# Patient Record
Sex: Female | Born: 2000
Health system: Southern US, Community
[De-identification: ages and names within clinical notes are randomized; demographics above are authoritative.]

## PROBLEM LIST (undated history)

## (undated) DIAGNOSIS — R51 Headache: Secondary | ICD-10-CM

## (undated) DIAGNOSIS — R519 Headache, unspecified: Secondary | ICD-10-CM

## (undated) HISTORY — DX: Headache, unspecified: R51.9

## (undated) HISTORY — DX: Headache: R51

---
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
## 2019-02-27 ENCOUNTER — Encounter (INDEPENDENT_AMBULATORY_CARE_PROVIDER_SITE_OTHER): Payer: Self-pay

## 2019-02-28 NOTE — Telephone Encounter (Signed)
Headache calendar from February 2020 on [place]. 28 days were recorded.  4 days were headache free.  24 days were associated with tension type headaches, 3 required treatment.  There were no days of migraines.   Headache calendar from March 2020 on [place]. 31 days were recorded.  4 days were headache free.  26 days were associated with tension type headaches, 4 required treatment.  There was 1 day of migraines, none were severe.  There is no reason to change current treatment.  I will contact the family.  Diane Robinson says that sumatriptan and helped her.

## 2019-03-04 ENCOUNTER — Ambulatory Visit (INDEPENDENT_AMBULATORY_CARE_PROVIDER_SITE_OTHER): Payer: Self-pay | Admitting: Pediatrics

## 2019-03-13 ENCOUNTER — Ambulatory Visit (INDEPENDENT_AMBULATORY_CARE_PROVIDER_SITE_OTHER): Payer: BLUE CROSS/BLUE SHIELD | Admitting: Pediatrics

## 2019-09-03 ENCOUNTER — Ambulatory Visit: Payer: Self-pay | Admitting: Allergy & Immunology

## 2019-09-03 ENCOUNTER — Other Ambulatory Visit: Payer: Self-pay

## 2020-03-30 ENCOUNTER — Other Ambulatory Visit: Payer: Self-pay

## 2020-03-30 ENCOUNTER — Ambulatory Visit (INDEPENDENT_AMBULATORY_CARE_PROVIDER_SITE_OTHER): Payer: No Typology Code available for payment source | Admitting: Family Medicine

## 2020-03-30 ENCOUNTER — Encounter: Payer: Self-pay | Admitting: Family Medicine

## 2020-03-30 VITALS — BP 111/77 | HR 99 | Temp 97.7°F | Ht <= 58 in | Wt 113.4 lb

## 2020-03-30 DIAGNOSIS — Z8481 Family history of carrier of genetic disease: Secondary | ICD-10-CM | POA: Diagnosis not present

## 2020-03-30 DIAGNOSIS — G43009 Migraine without aura, not intractable, without status migrainosus: Secondary | ICD-10-CM

## 2020-03-30 DIAGNOSIS — Z800 Family history of malignant neoplasm of digestive organs: Secondary | ICD-10-CM | POA: Insufficient documentation

## 2020-03-30 DIAGNOSIS — Z803 Family history of malignant neoplasm of breast: Secondary | ICD-10-CM | POA: Insufficient documentation

## 2020-03-30 DIAGNOSIS — Z8349 Family history of other endocrine, nutritional and metabolic diseases: Secondary | ICD-10-CM | POA: Insufficient documentation

## 2020-03-30 NOTE — Assessment & Plan Note (Signed)
Family history of breast cancer in her father's mother.  Diagnosed in her 21s.   Unsure of any other family members that have been tested for BRCA gene but her mother reports that she believes that the grandmother did have the BRCA gene.

## 2020-03-30 NOTE — Assessment & Plan Note (Signed)
Mother has history of hypothyroidism diagnosed in her early 53s.  Doing well with treatment.  Does not have any signs symptoms of hypothyroidism today in the office.  We will continue to assess for this and test as needed.

## 2020-03-30 NOTE — Assessment & Plan Note (Signed)
Mother's mother had colon cancer later on in life.  She reports that she was unsure if she had colon screenings as appropriate.

## 2020-03-30 NOTE — Progress Notes (Signed)
Subjective:  Patient ID: Diane Robinson, female    DOB: 2001-06-20  Age: 19 y.o. MRN: [id number]  CC:  Chief Complaint  Patient presents with  . New Patient (Initial Visit)    has been awhile since seeing any provider no concerns besides establishing care       HPI  HPI Diane Robinson is a 19 year old female patient who presents today to establish care.  Has not been seen by a provider since being in a pediatrics office.  She presents today with her twin, and her mother is in the room to provide family history and then left the room to provide them with privacy.  Significant history includes migraines.  In reviewing her chart it looks like she had postconcussion syndrome.  She reports that she takes Advil as needed for headaches.  Additionally uses sumatriptan hand and that works very well but she is not had to use it in some time.  Per her mother: Family history that significant for his mother that has hypothyroidism diagnosed in her early 39s, unsure of their father, type 2 diabetes, hypertension, colon cancer and her mother's mother, father's mother had breast cancer diagnosed in her 79s, she believes that she was BRCA positive.  The girls and not had genetic test.  Unsure of insurance coverage with this.  Neither one of them of had any changes to the breast or felt any lumps.  She eats all food groups.  Reports that she drinks some ice coffee maybe once a month.  But that is about it but she will drink sodas throughout the week at times.  Is currently in school at [employer] is unsure of exactly what she wants to do though.  Did want to be a nurse but that is kind of changed.  She enjoys being outdoors and napping.  She denies having any issues or concerns today to discuss.  Today patient denies signs and symptoms of COVID 19 infection including fever, chills, cough, shortness of breath, and headache. Past Medical, Surgical, Social History, Allergies, and  Medications have been Reviewed.   Past Medical History:  Diagnosis Date  . Concussion with loss of consciousness 01/28/2015  . Dizziness due to old head trauma 01/28/2015  . Episodic tension-type headache, not intractable 09/13/2016  . Headache   . Postconcussion syndrome 01/28/2015  . Posttraumatic headache 01/28/2015    No outpatient medications have been marked as taking for the 03/30/20 encounter (Office Visit) with Perlie Mayo, NP.    ROS:  Review of Systems  Constitutional: Negative.   HENT: Negative.   Eyes: Negative.   Respiratory: Negative.   Cardiovascular: Negative.   Gastrointestinal: Negative.   Genitourinary: Negative.   Musculoskeletal: Negative.   Skin: Negative.   Neurological: Negative.   Endo/Heme/Allergies: Negative.   Psychiatric/Behavioral: Negative.   All other systems reviewed and are negative.    Objective:   Today's Vitals: BP 111/77 (BP Location: Right Arm, Patient Position: Sitting, Cuff Size: Normal)   Pulse 99   Temp 97.7 F (36.5 C) (Temporal)   Ht '4\' 10"'$  (1.473 m)   Wt 113 lb 6.4 oz (51.4 kg)   SpO2 99%   BMI 23.70 kg/m  Vitals with BMI 03/30/2020 09/10/2018 11/27/2017  Height '4\' 10"'$  '4\' 10"'$  '4\' 10"'$   Weight 113 lbs 6 oz 108 lbs 105 lbs 3 oz  BMI 23.71 87.56 43.32  Systolic 951 884 166  Diastolic 77 78 70  Pulse 99 68 100  Physical Exam Vitals and nursing note reviewed.  Constitutional:      Appearance: Normal appearance. She is well-developed, well-groomed and normal weight.  HENT:     Head: Normocephalic and atraumatic.     Right Ear: External ear normal.     Left Ear: External ear normal.     Mouth/Throat:     Comments: Mask in place Eyes:     General:        Right eye: No discharge.        Left eye: No discharge.     Conjunctiva/sclera: Conjunctivae normal.  Cardiovascular:     Rate and Rhythm: Normal rate and regular rhythm.     Pulses: Normal pulses.     Heart sounds: Normal heart sounds.  Pulmonary:      Effort: Pulmonary effort is normal.     Breath sounds: Normal breath sounds.  Musculoskeletal:        General: Normal range of motion.     Cervical back: Normal range of motion and neck supple.  Skin:    General: Skin is warm.  Neurological:     General: No focal deficit present.     Mental Status: She is alert and oriented to person, place, and time.  Psychiatric:        Attention and Perception: Attention normal.        Mood and Affect: Mood normal.        Speech: Speech normal.        Behavior: Behavior normal. Behavior is cooperative.        Thought Content: Thought content normal.        Cognition and Memory: Cognition normal.        Judgment: Judgment normal.     Assessment   1. Migraine without aura and without status migrainosus, not intractable   2. Family history of breast cancer   3. Family history of BRCA gene mutation   4. Family history of hypothyroidism   5. Family history of colon cancer     Tests ordered No orders of the defined types were placed in this encounter.    Plan: Please see assessment and plan per problem list above.   No orders of the defined types were placed in this encounter.   Patient to follow-up in 1 year follow-up  Perlie Mayo, NP

## 2020-03-30 NOTE — Assessment & Plan Note (Signed)
She reports she is doing well on this.  Usually just uses Advil as needed.  Not even using her sumatriptan as she used to.

## 2020-03-30 NOTE — Patient Instructions (Addendum)
I appreciate the opportunity to provide you with care for your health and wellness. Today we discussed: establish care   Follow up: 1 year for annual   No labs or referrals today  Great to meet you today! Have a great summer :)  Please continue to practice social distancing to keep you, your family, and our community safe.  If you must go out, please wear a mask and practice good handwashing.  It was a pleasure to see you and I look forward to continuing to work together on your health and well-being. Please do not hesitate to call the office if you need care or have questions about your care.  Have a wonderful day and week. With Gratitude, Tereasa Coop, DNP, AGNP-BC

## 2020-03-30 NOTE — Assessment & Plan Note (Signed)
Father's mother had breast cancer mother reports today that she thinks she left BRCA positive.  There is been no testing genetically of Diane Robinson.  We will be reviewing guidelines and making sure that we stay up-to-date on this high risk concern

## 2020-11-10 ENCOUNTER — Other Ambulatory Visit: Payer: No Typology Code available for payment source

## 2020-11-10 DIAGNOSIS — Z20822 Contact with and (suspected) exposure to covid-19: Secondary | ICD-10-CM

## 2020-11-13 LAB — NOVEL CORONAVIRUS, NAA: SARS-CoV-2, NAA: NOT DETECTED

## 2021-03-16 ENCOUNTER — Encounter (INDEPENDENT_AMBULATORY_CARE_PROVIDER_SITE_OTHER): Payer: Self-pay

## 2021-03-31 ENCOUNTER — Encounter: Payer: No Typology Code available for payment source | Admitting: Nurse Practitioner

## 2021-05-18 ENCOUNTER — Ambulatory Visit (INDEPENDENT_AMBULATORY_CARE_PROVIDER_SITE_OTHER): Payer: No Typology Code available for payment source | Admitting: Nurse Practitioner

## 2021-05-18 ENCOUNTER — Other Ambulatory Visit: Payer: Self-pay

## 2021-05-18 ENCOUNTER — Ambulatory Visit: Payer: Self-pay | Admitting: Nurse Practitioner

## 2021-05-18 ENCOUNTER — Encounter: Payer: Self-pay | Admitting: Nurse Practitioner

## 2021-05-18 VITALS — BP 102/60 | HR 97 | Temp 98.0°F | Ht <= 58 in | Wt 127.8 lb

## 2021-05-18 DIAGNOSIS — Z6826 Body mass index (BMI) 26.0-26.9, adult: Secondary | ICD-10-CM

## 2021-05-18 DIAGNOSIS — E559 Vitamin D deficiency, unspecified: Secondary | ICD-10-CM | POA: Diagnosis not present

## 2021-05-18 DIAGNOSIS — Z7689 Persons encountering health services in other specified circumstances: Secondary | ICD-10-CM | POA: Diagnosis not present

## 2021-05-18 DIAGNOSIS — H6123 Impacted cerumen, bilateral: Secondary | ICD-10-CM

## 2021-05-18 DIAGNOSIS — E663 Overweight: Secondary | ICD-10-CM

## 2021-05-18 DIAGNOSIS — Z0000 Encounter for general adult medical examination without abnormal findings: Secondary | ICD-10-CM | POA: Diagnosis not present

## 2021-05-18 NOTE — Progress Notes (Signed)
I,Tianna Badgett,acting as a Education administrator for [employer], NP.,have documented all relevant documentation on the behalf of [employer], NP,as directed by  Bary Castilla, NP while in the presence of Bary Castilla, NP.  This visit occurred during the SARS-CoV-2 public health emergency.  Safety protocols were in place, including screening questions prior to the visit, additional usage of staff PPE, and extensive cleaning of exam room while observing appropriate contact time as indicated for disinfecting solutions.  Subjective:     Patient ID: Diane Robinson , female    DOB: 09-01-2001 , 20 y.o.   MRN: [id number]   Chief Complaint  Patient presents with   Establish Care    HPI  Patient is here to establish care. She has not concerns at this time. She is a Ship broker at [employer].  She is not sure what she wants to do.  Sexually active: No  LMP: June 12th 2022. Normal  No BC  She does not drink or smoke      Past Medical History:  Diagnosis Date   Concussion with loss of consciousness 01/28/2015   Dizziness due to old head trauma 01/28/2015   Episodic tension-type headache, not intractable 09/13/2016   Headache    Postconcussion syndrome 01/28/2015   Posttraumatic headache 01/28/2015     Family History  Problem Relation Age of Onset   Cancer Paternal Grandmother    Breast cancer Paternal Grandmother     No current outpatient medications on file.   No Known Allergies    The patient states she uses none for birth control. Last LMP was Patient's last menstrual period was 04/23/2021.. Negative for Dysmenorrhea. Negative for: breast discharge, breast lump(s), breast pain and breast self exam. Associated symptoms include abnormal vaginal bleeding. Pertinent negatives include abnormal bleeding (hematology), anxiety, decreased libido, depression, difficulty falling sleep, dyspareunia, history of infertility, nocturia, sexual dysfunction, sleep disturbances, urinary incontinence,  urinary urgency, vaginal discharge and vaginal itching. Diet regular.The patient states her exercise level is    . The patient's tobacco use is:  Social History   Tobacco Use  Smoking Status Never  Smokeless Tobacco Never  . She has been exposed to passive smoke. The patient's alcohol use is:  Social History   Substance and Sexual Activity  Alcohol Use No  . Additional information: Last pap never, next one scheduled for next year.    Review of Systems  Constitutional: Negative.   HENT: Negative.    Eyes: Negative.   Respiratory: Negative.    Cardiovascular: Negative.   Gastrointestinal: Negative.   Endocrine: Negative.   Genitourinary: Negative.   Musculoskeletal: Negative.   Skin: Negative.   Allergic/Immunologic: Negative.   Neurological: Negative.   Hematological: Negative.   Psychiatric/Behavioral: Negative.      Today's Vitals   05/18/21 1422  BP: 102/60  Pulse: 97  Temp: 98 F (36.7 C)  TempSrc: Oral  Weight: 127 lb 12.8 oz (58 kg)  Height: $Remove'4\' 10"'RRFtWhQ$  (1.473 m)   Body mass index is 26.71 kg/m.  Wt Readings from Last 3 Encounters:  05/18/21 127 lb 12.8 oz (58 kg)  03/30/20 113 lb 6.4 oz (51.4 kg) (22 %, Z= -0.78)*  09/10/18 108 lb (49 kg) (17 %, Z= -0.96)*   * Growth percentiles are based on CDC (Girls, 2-20 Years) data.    Objective:  Physical Exam Vitals and nursing note reviewed.  Constitutional:      Appearance: Normal appearance.  HENT:     Head: Normocephalic and atraumatic.  Right Ear: Tympanic membrane, ear canal and external ear normal. There is impacted cerumen.     Left Ear: Tympanic membrane, ear canal and external ear normal. There is impacted cerumen.     Nose: Nose normal.     Mouth/Throat:     Mouth: Mucous membranes are moist.     Pharynx: Oropharynx is clear.  Eyes:     Extraocular Movements: Extraocular movements intact.     Conjunctiva/sclera: Conjunctivae normal.     Pupils: Pupils are equal, round, and reactive to light.   Cardiovascular:     Rate and Rhythm: Normal rate and regular rhythm.     Pulses: Normal pulses.     Heart sounds: Normal heart sounds.  Pulmonary:     Effort: Pulmonary effort is normal.     Breath sounds: Normal breath sounds.  Chest:  Breasts:    Tanner Score is 5.     Right: Normal.     Left: Normal.  Abdominal:     General: Abdomen is flat. Bowel sounds are normal.     Palpations: Abdomen is soft.  Genitourinary:    Comments: deferred Musculoskeletal:        General: Normal range of motion.     Cervical back: Normal range of motion and neck supple.  Skin:    General: Skin is warm and dry.  Neurological:     General: No focal deficit present.     Mental Status: She is alert and oriented to person, place, and time.  Psychiatric:        Mood and Affect: Mood normal.        Behavior: Behavior normal.        Assessment And Plan:     1. Establishing care with new doctor, encounter for -Patient is here to establish care. Martin Majestic over patient medical, family, social and surgical history. -Reviewed with patient their medications and any allergies  -Reviewed with patient their sexual orientation, drug/tobacco and alcohol use -Dicussed any new concerns with patient  -recommended patient comes in for a physical exam and complete blood work.  -Educated patient about the importance of annual screenings and immunizations.  -Advised patient to eat a healthy diet along with exercise for atleast 30-45 min atleast 4-5 days of the week.   2. Annual physical exam --Patient is here for their annual physical exam and we discussed any changes to medication and medical history.  -Behavior modification was discussed as well as diet and exercise history  -Patient will continue to exercise regularly and modify their diet.  -Recommendation for yearly physical annuals, immunization and screenings including mammogram and colonoscopy were discussed with the patient.  -Recommended intake of  multivitamin, vitamin D and calcium.  -Individualized advise was given to the patient pertaining to their own health history in regards to diet, exercise, medical condition and referrals.  - CBC - Hemoglobin A1c - CMP14+EGFR - Lipid panel - Hepatitis C antibody - HIV Antibody (routine testing w rflx)  3. Vitamin D deficiency -Will check and supplement if needed. Advised patient to spend atleast 15 min. Daily in sunlight.  - Vitamin D (25 hydroxy)  4. Bilateral impacted cerumen -Flushed and used Curette in both ears.   5. Overweight with body mass index (BMI) of 26 to 26.9 in adult Advised patient on a healthy diet including avoiding fast food and red meats. Increase the intake of lean meats including grilled chicken and [place].  Drink a lot of water. Decrease intake of fatty foods. Exercise  for 30-45 min. 4-5 a week to decrease the risk of cardiac event.   The patient was encouraged to call or send a message through [place] for any questions or concerns.   Follow up: 1 year for annual physical exam.   Staying healthy and adopting a healthy lifestyle for your overall health is important. You should eat 7 or more servings of fruits and vegetables per day. You should drink plenty of water to keep yourself hydrated and your kidneys healthy. This includes about 65-80+ fluid ounces of water. Limit your intake of animal fats especially for elevated cholesterol. Avoid highly processed food and limit your salt intake if you have hypertension. Avoid foods high in saturated/Trans fats. Along with a healthy diet it is also very important to maintain time for yourself to maintain a healthy mental health with low stress levels. You should get atleast 150 min of moderate intensity exercise weekly for a healthy heart. Along with eating right and exercising, aim for at least 7-9 hours of sleep daily.  Eat more whole grains which includes barley, wheat berries, oats, brown rice and whole wheat pasta. Use  healthy plant oils which include olive, soy, corn, sunflower and peanut. Limit your caffeine and sugary drinks. Limit your intake of fast foods. Limit milk and dairy products to one or two daily servings.   Patient was given opportunity to ask questions. Patient verbalized understanding of the plan and was able to repeat key elements of the plan. All questions were answered to their satisfaction.  Raman Lehi Phifer, DNP   I, Raman Dacotah Cabello have reviewed all documentation for this visit. The documentation on 05/18/21 for the exam, diagnosis, procedures, and orders are all accurate and complete.    THE PATIENT IS ENCOURAGED TO PRACTICE SOCIAL DISTANCING DUE TO THE COVID-19 PANDEMIC.

## 2021-05-18 NOTE — Patient Instructions (Signed)
Health Maintenance, Female Adopting a healthy lifestyle and getting preventive care are important in promoting health and wellness. Ask your health care provider about: The right schedule for you to have regular tests and exams. Things you can do on your own to prevent diseases and keep yourself healthy. What should I know about diet, weight, and exercise? Eat a healthy diet  Eat a diet that includes plenty of vegetables, fruits, low-fat dairy products, and lean protein. Do not eat a lot of foods that are high in solid fats, added sugars, or sodium.  Maintain a healthy weight Body mass index (BMI) is used to identify weight problems. It estimates body fat based on height and weight. Your health care provider can help determineyour BMI and help you achieve or maintain a healthy weight. Get regular exercise Get regular exercise. This is one of the most important things you can do for your health. Most adults should: Exercise for at least 150 minutes each week. The exercise should increase your heart rate and make you sweat (moderate-intensity exercise). Do strengthening exercises at least twice a week. This is in addition to the moderate-intensity exercise. Spend less time sitting. Even light physical activity can be beneficial. Watch cholesterol and blood lipids Have your blood tested for lipids and cholesterol at 20 years of age, then havethis test every 5 years. Have your cholesterol levels checked more often if: Your lipid or cholesterol levels are high. You are older than 21 years of age. You are at high risk for heart disease. What should I know about cancer screening? Depending on your health history and family history, you may need to have cancer screening at various ages. This may include screening for: Breast cancer. Cervical cancer. Colorectal cancer. Skin cancer. Lung cancer. What should I know about heart disease, diabetes, and high blood pressure? Blood pressure and heart  disease High blood pressure causes heart disease and increases the risk of stroke. This is more likely to develop in people who have high blood pressure readings, are of African descent, or are overweight. Have your blood pressure checked: Every 3-5 years if you are 18-39 years of age. Every year if you are 40 years old or older. Diabetes Have regular diabetes screenings. This checks your fasting blood sugar level. Have the screening done: Once every three years after age 40 if you are at a normal weight and have a low risk for diabetes. More often and at a younger age if you are overweight or have a high risk for diabetes. What should I know about preventing infection? Hepatitis B If you have a higher risk for hepatitis B, you should be screened for this virus. Talk with your health care provider to find out if you are at risk forhepatitis B infection. Hepatitis C Testing is recommended for: Everyone born from 1945 through 1965. Anyone with known risk factors for hepatitis C. Sexually transmitted infections (STIs) Get screened for STIs, including gonorrhea and chlamydia, if: You are sexually active and are younger than 21 years of age. You are older than 21 years of age and your health care provider tells you that you are at risk for this type of infection. Your sexual activity has changed since you were last screened, and you are at increased risk for chlamydia or gonorrhea. Ask your health care provider if you are at risk. Ask your health care provider about whether you are at high risk for HIV. Your health care provider may recommend a prescription medicine to help   prevent HIV infection. If you choose to take medicine to prevent HIV, you should first get tested for HIV. You should then be tested every 3 months for as long as you are taking the medicine. Pregnancy If you are about to stop having your period (premenopausal) and you may become pregnant, seek counseling before you get  pregnant. Take 400 to 800 micrograms (mcg) of folic acid every day if you become pregnant. Ask for birth control (contraception) if you want to prevent pregnancy. Osteoporosis and menopause Osteoporosis is a disease in which the bones lose minerals and strength with aging. This can result in bone fractures. If you are 65 years old or older, or if you are at risk for osteoporosis and fractures, ask your health care provider if you should: Be screened for bone loss. Take a calcium or vitamin D supplement to lower your risk of fractures. Be given hormone replacement therapy (HRT) to treat symptoms of menopause. Follow these instructions at home: Lifestyle Do not use any products that contain nicotine or tobacco, such as cigarettes, e-cigarettes, and chewing tobacco. If you need help quitting, ask your health care provider. Do not use street drugs. Do not share needles. Ask your health care provider for help if you need support or information about quitting drugs. Alcohol use Do not drink alcohol if: Your health care provider tells you not to drink. You are pregnant, may be pregnant, or are planning to become pregnant. If you drink alcohol: Limit how much you use to 0-1 drink a day. Limit intake if you are breastfeeding. Be aware of how much alcohol is in your drink. In the [place] one drink equals one 12 oz bottle of beer (355 mL), one 5 oz glass of wine (148 mL), or one 1 oz glass of hard liquor (44 mL). General instructions Schedule regular health, dental, and eye exams. Stay current with your vaccines. Tell your health care provider if: You often feel depressed. You have ever been abused or do not feel safe at home. Summary Adopting a healthy lifestyle and getting preventive care are important in promoting health and wellness. Follow your health care provider's instructions about healthy diet, exercising, and getting tested or screened for diseases. Follow your health care provider's  instructions on monitoring your cholesterol and blood pressure. This information is not intended to replace advice given to you by your health care provider. Make sure you discuss any questions you have with your healthcare provider. Document Revised: 10/22/2018 Document Reviewed: 10/22/2018 Elsevier Patient Education  2022 [employer]  

## 2021-05-19 ENCOUNTER — Other Ambulatory Visit: Payer: Self-pay | Admitting: Nurse Practitioner

## 2021-05-19 DIAGNOSIS — E559 Vitamin D deficiency, unspecified: Secondary | ICD-10-CM

## 2021-05-19 LAB — CMP14+EGFR
ALT: 12 IU/L (ref 0–32)
AST: 19 IU/L (ref 0–40)
Albumin/Globulin Ratio: 1.5 (ref 1.2–2.2)
Albumin: 4.7 g/dL (ref 3.9–5.0)
Alkaline Phosphatase: 43 IU/L (ref 42–106)
BUN/Creatinine Ratio: 11 (ref 9–23)
BUN: 10 mg/dL (ref 6–20)
Bilirubin Total: 0.3 mg/dL (ref 0.0–1.2)
CO2: 22 mmol/L (ref 20–29)
Calcium: 9.2 mg/dL (ref 8.7–10.2)
Chloride: 107 mmol/L — ABNORMAL HIGH (ref 96–106)
Creatinine, Ser: 0.9 mg/dL (ref 0.57–1.00)
Globulin, Total: 3.1 g/dL (ref 1.5–4.5)
Glucose: 102 mg/dL — ABNORMAL HIGH (ref 65–99)
Potassium: 4.1 mmol/L (ref 3.5–5.2)
Sodium: 144 mmol/L (ref 134–144)
Total Protein: 7.8 g/dL (ref 6.0–8.5)
eGFR: 94 mL/min/{1.73_m2} (ref >59)

## 2021-05-19 LAB — HEMOGLOBIN A1C
Est. average glucose Bld gHb Est-mCnc: 97 mg/dL
Hgb A1c MFr Bld: 5 % (ref 4.8–5.6)

## 2021-05-19 LAB — CBC
Hematocrit: 38.5 % (ref 34.0–46.6)
Hemoglobin: 13.2 g/dL (ref 11.1–15.9)
MCH: 29.5 pg (ref 26.6–33.0)
MCHC: 34.3 g/dL (ref 31.5–35.7)
MCV: 86 fL (ref 79–97)
Platelets: 365 10*3/uL (ref 150–450)
RBC: 4.47 x10E6/uL (ref 3.77–5.28)
RDW: 12.5 % (ref 11.7–15.4)
WBC: 5.4 10*3/uL (ref 3.4–10.8)

## 2021-05-19 LAB — HIV ANTIBODY (ROUTINE TESTING W REFLEX): HIV Screen 4th Generation wRfx: NONREACTIVE

## 2021-05-19 LAB — LIPID PANEL
Chol/HDL Ratio: 2.2 ratio (ref 0.0–4.4)
Cholesterol, Total: 135 mg/dL (ref 100–199)
HDL: 62 mg/dL (ref >39)
LDL Chol Calc (NIH): 62 mg/dL (ref 0–99)
Triglycerides: 51 mg/dL (ref 0–149)
VLDL Cholesterol Cal: 11 mg/dL (ref 5–40)

## 2021-05-19 LAB — HEPATITIS C ANTIBODY: Hep C Virus Ab: 0.1 s/co ratio (ref 0.0–0.9)

## 2021-05-19 LAB — VITAMIN D 25 HYDROXY (VIT D DEFICIENCY, FRACTURES): Vit D, 25-Hydroxy: 22 ng/mL — ABNORMAL LOW (ref 30.0–100.0)

## 2021-05-19 MED ORDER — VITAMIN D (ERGOCALCIFEROL) 1.25 MG (50000 UNIT) PO CAPS: 50000.0000 [IU] | ORAL_CAPSULE | ORAL | 0 refills | Status: DC

## 2021-05-22 ENCOUNTER — Other Ambulatory Visit (HOSPITAL_COMMUNITY): Payer: Self-pay

## 2021-06-02 ENCOUNTER — Ambulatory Visit: Payer: No Typology Code available for payment source | Attending: Internal Medicine

## 2021-06-02 DIAGNOSIS — Z20822 Contact with and (suspected) exposure to covid-19: Secondary | ICD-10-CM

## 2021-06-03 LAB — SPECIMEN STATUS REPORT

## 2021-06-03 LAB — SARS-COV-2, NAA 2 DAY TAT

## 2021-06-03 LAB — NOVEL CORONAVIRUS, NAA: SARS-CoV-2, NAA: NOT DETECTED

## 2021-06-21 ENCOUNTER — Other Ambulatory Visit (HOSPITAL_COMMUNITY): Payer: Self-pay

## 2021-06-21 MED ORDER — ERGOCALCIFEROL 1.25 MG (50000 UT) PO CAPS
50000.0000 [IU] | ORAL_CAPSULE | ORAL | 0 refills | Status: DC
  Filled 2021-06-21 (×2): qty 8, 56d supply, fill #0

## 2021-08-09 ENCOUNTER — Other Ambulatory Visit: Payer: Self-pay | Admitting: Nurse Practitioner

## 2021-08-09 ENCOUNTER — Other Ambulatory Visit (HOSPITAL_COMMUNITY): Payer: Self-pay

## 2021-08-09 MED ORDER — ERGOCALCIFEROL 1.25 MG (50000 UT) PO CAPS
50000.0000 [IU] | ORAL_CAPSULE | ORAL | 0 refills | Status: DC
  Filled 2021-08-09 – 2021-08-23 (×2): qty 8, 56d supply, fill #0

## 2021-08-12 ENCOUNTER — Other Ambulatory Visit (HOSPITAL_COMMUNITY): Payer: Self-pay

## 2021-08-18 ENCOUNTER — Other Ambulatory Visit (HOSPITAL_COMMUNITY): Payer: Self-pay

## 2021-08-23 ENCOUNTER — Other Ambulatory Visit (HOSPITAL_COMMUNITY): Payer: Self-pay

## 2021-09-13 ENCOUNTER — Other Ambulatory Visit: Payer: Self-pay

## 2021-09-13 ENCOUNTER — Encounter: Payer: Self-pay | Admitting: Nurse Practitioner

## 2021-09-13 ENCOUNTER — Other Ambulatory Visit (HOSPITAL_COMMUNITY): Payer: Self-pay

## 2021-09-13 ENCOUNTER — Ambulatory Visit (INDEPENDENT_AMBULATORY_CARE_PROVIDER_SITE_OTHER): Payer: No Typology Code available for payment source | Admitting: Nurse Practitioner

## 2021-09-13 VITALS — BP 110/70 | HR 95 | Temp 99.0°F | Ht <= 58 in | Wt 130.2 lb

## 2021-09-13 DIAGNOSIS — L0292 Furuncle, unspecified: Secondary | ICD-10-CM

## 2021-09-13 DIAGNOSIS — Z2821 Immunization not carried out because of patient refusal: Secondary | ICD-10-CM

## 2021-09-13 DIAGNOSIS — Z8616 Personal history of COVID-19: Secondary | ICD-10-CM

## 2021-09-13 MED ORDER — DOXYCYCLINE MONOHYDRATE 100 MG PO CAPS
100.0000 mg | ORAL_CAPSULE | Freq: Two times a day (BID) | ORAL | 0 refills | Status: DC
  Filled 2021-09-13: qty 14, 7d supply, fill #0

## 2021-09-13 NOTE — Progress Notes (Signed)
I,Yamilka J Llittleton,acting as a Neurosurgeon for [employer], FNP.,have documented all relevant documentation on the behalf of Arnette Felts, FNP,as directed by  Arnette Felts, FNP while in the presence of Arnette Felts, FNP.  This visit occurred during the SARS-CoV-2 public health emergency.  Safety protocols were in place, including screening questions prior to the visit, additional usage of staff PPE, and extensive cleaning of exam room while observing appropriate contact time as indicated for disinfecting solutions.  Subjective:     Patient ID: Diane Robinson , female    DOB: 29-Jun-2001 , 20 y.o.   MRN: [id number]   Chief Complaint  Patient presents with   bump    HPI  Patient stated a bump popped up near her bikini line a few days ago. She stated the bump looks like it has fluid in it. She denies having any pain or the bump changing in size.  She does shave, does not use a new razor each time. Denies fever. Denies pain. She has not done any warm compresses, but has let the warm water from the shower on it.      Past Medical History:  Diagnosis Date   Concussion with loss of consciousness 01/28/2015   Dizziness due to old head trauma 01/28/2015   Episodic tension-type headache, not intractable 09/13/2016   Headache    Postconcussion syndrome 01/28/2015   Posttraumatic headache 01/28/2015     Family History  Problem Relation Age of Onset   Cancer Paternal Grandmother    Breast cancer Paternal Grandmother      Current Outpatient Medications:    doxycycline (MONODOX) 100 MG capsule, Take 1 capsule (100 mg total) by mouth 2 (two) times daily., Disp: 14 capsule, Rfl: 0   ergocalciferol (VITAMIN D2) 1.25 MG (50000 UT) capsule, Take 1 capsule (50,000 Units total) by mouth every 7 (seven) days., Disp: 8 capsule, Rfl: 0   No Known Allergies   Review of Systems  Constitutional: Negative.   Eyes: Negative.   Respiratory: Negative.    Cardiovascular: Negative.   Skin: Negative.         She has a "bump" to her inner left thigh.   Psychiatric/Behavioral: Negative.      Today's Vitals   09/13/21 1208  BP: 110/70  Pulse: 95  Temp: 99 F (37.2 C)  Weight: 130 lb 3.2 oz (59.1 kg)  Height: 4\' 10"  (1.473 m)  PainSc: 0-No pain   Body mass index is 27.21 kg/m.   Objective:  Physical Exam Vitals reviewed.  Constitutional:      General: She is not in acute distress.    Appearance: Normal appearance.  Pulmonary:     Effort: Pulmonary effort is normal. No respiratory distress.  Skin:    General: Skin is warm and dry.     Comments: Left inner thigh with semi soft boil with erythema present  Neurological:     General: No focal deficit present.     Mental Status: She is alert and oriented to person, place, and time.     Cranial Nerves: No cranial nerve deficit.     Motor: No weakness.  Psychiatric:        Mood and Affect: Mood normal.        Behavior: Behavior normal.        Thought Content: Thought content normal.        Judgment: Judgment normal.        Assessment And Plan:     1. Boil Comments: Semi  soft boil present with erythema, will treat with antibiotic Warm compress to area or soak Encouraged to change razors when shaving - doxycycline (MONODOX) 100 MG capsule; Take 1 capsule (100 mg total) by mouth 2 (two) times daily.  Dispense: 14 capsule; Refill: 0  2. Influenza vaccination declined Patient declined influenza vaccination at this time. Patient is aware that influenza vaccine prevents illness in 70% of healthy people, and reduces hospitalizations to 30-70% in elderly. This vaccine is recommended annually. Pt is willing to accept risk associated with refusing vaccination.  3. History of COVID-19 Comments: December 2021  Declines covid 19 vaccine. Discussed risk of covid 49 and if she changes her mind about the vaccine to call the office.  Encouraged to take multivitamin, vitamin d, vitamin c and zinc to increase immune system. Aware can call office  if would like to have vaccine here at office.     Patient was given opportunity to ask questions. Patient verbalized understanding of the plan and was able to repeat key elements of the plan. All questions were answered to their satisfaction.  Arnette Felts, FNP   I, Arnette Felts, FNP, have reviewed all documentation for this visit. The documentation on 09/13/21 for the exam, diagnosis, procedures, and orders are all accurate and complete.   IF YOU HAVE BEEN REFERRED TO A SPECIALIST, IT MAY TAKE 1-2 WEEKS TO SCHEDULE/PROCESS THE REFERRAL. IF YOU HAVE NOT HEARD FROM US/SPECIALIST IN TWO WEEKS, PLEASE GIVE [place] A CALL AT [phone].   THE PATIENT IS ENCOURAGED TO PRACTICE SOCIAL DISTANCING DUE TO THE COVID-19 PANDEMIC.

## 2021-10-19 ENCOUNTER — Other Ambulatory Visit (HOSPITAL_COMMUNITY): Payer: Self-pay

## 2021-10-19 ENCOUNTER — Other Ambulatory Visit: Payer: Self-pay | Admitting: Nurse Practitioner

## 2021-10-19 MED ORDER — ERGOCALCIFEROL 1.25 MG (50000 UT) PO CAPS
50000.0000 [IU] | ORAL_CAPSULE | ORAL | 0 refills | Status: DC
  Filled 2021-10-19: qty 8, 56d supply, fill #0

## 2021-10-20 ENCOUNTER — Other Ambulatory Visit (HOSPITAL_COMMUNITY): Payer: Self-pay

## 2021-11-07 ENCOUNTER — Other Ambulatory Visit: Payer: Self-pay

## 2021-11-07 ENCOUNTER — Ambulatory Visit (INDEPENDENT_AMBULATORY_CARE_PROVIDER_SITE_OTHER): Payer: No Typology Code available for payment source

## 2021-11-07 VITALS — BP 108/78 | HR 83 | Temp 98.8°F | Ht <= 58 in | Wt 131.8 lb

## 2021-11-07 DIAGNOSIS — Z23 Encounter for immunization: Secondary | ICD-10-CM | POA: Diagnosis not present

## 2021-11-07 NOTE — Progress Notes (Signed)
Pt is here today for tdap immunization.

## 2021-11-22 ENCOUNTER — Other Ambulatory Visit (HOSPITAL_COMMUNITY): Payer: Self-pay

## 2021-11-22 ENCOUNTER — Other Ambulatory Visit: Payer: Self-pay | Admitting: Nurse Practitioner

## 2021-11-22 MED ORDER — ERGOCALCIFEROL 1.25 MG (50000 UT) PO CAPS
50000.0000 [IU] | ORAL_CAPSULE | ORAL | 0 refills | Status: DC
  Filled 2021-11-22 – 2021-12-12 (×2): qty 8, 56d supply, fill #0

## 2021-12-12 ENCOUNTER — Other Ambulatory Visit (HOSPITAL_COMMUNITY): Payer: Self-pay

## 2021-12-18 ENCOUNTER — Other Ambulatory Visit (HOSPITAL_COMMUNITY): Payer: Self-pay

## 2022-02-14 ENCOUNTER — Other Ambulatory Visit (HOSPITAL_COMMUNITY): Payer: Self-pay

## 2022-02-14 ENCOUNTER — Other Ambulatory Visit: Payer: Self-pay

## 2022-02-16 ENCOUNTER — Other Ambulatory Visit (HOSPITAL_COMMUNITY): Payer: Self-pay

## 2022-02-20 ENCOUNTER — Other Ambulatory Visit (HOSPITAL_COMMUNITY): Payer: Self-pay

## 2022-03-01 ENCOUNTER — Other Ambulatory Visit (HOSPITAL_COMMUNITY): Payer: Self-pay

## 2022-03-02 ENCOUNTER — Other Ambulatory Visit (HOSPITAL_COMMUNITY): Payer: Self-pay

## 2022-03-02 ENCOUNTER — Other Ambulatory Visit: Payer: Self-pay

## 2022-03-02 ENCOUNTER — Other Ambulatory Visit: Payer: Self-pay | Admitting: Nurse Practitioner

## 2022-03-02 MED ORDER — ERGOCALCIFEROL 1.25 MG (50000 UT) PO CAPS
50000.0000 [IU] | ORAL_CAPSULE | ORAL | 0 refills | Status: DC
  Filled 2022-03-02: qty 8, 56d supply, fill #0

## 2022-05-23 ENCOUNTER — Encounter: Payer: No Typology Code available for payment source | Admitting: Nurse Practitioner

## 2022-06-22 ENCOUNTER — Other Ambulatory Visit (HOSPITAL_COMMUNITY): Payer: Self-pay

## 2022-09-13 ENCOUNTER — Ambulatory Visit: Payer: No Typology Code available for payment source | Admitting: Internal Medicine

## 2022-09-14 ENCOUNTER — Ambulatory Visit: Payer: No Typology Code available for payment source | Admitting: Internal Medicine

## 2023-01-17 ENCOUNTER — Ambulatory Visit (INDEPENDENT_AMBULATORY_CARE_PROVIDER_SITE_OTHER): Payer: 59 | Admitting: Family Medicine

## 2023-01-17 ENCOUNTER — Encounter: Payer: Self-pay | Admitting: Family Medicine

## 2023-01-17 VITALS — BP 117/74 | HR 84 | Ht <= 58 in | Wt 127.0 lb

## 2023-01-17 DIAGNOSIS — E038 Other specified hypothyroidism: Secondary | ICD-10-CM

## 2023-01-17 DIAGNOSIS — E7849 Other hyperlipidemia: Secondary | ICD-10-CM

## 2023-01-17 DIAGNOSIS — G43009 Migraine without aura, not intractable, without status migrainosus: Secondary | ICD-10-CM

## 2023-01-17 DIAGNOSIS — R7301 Impaired fasting glucose: Secondary | ICD-10-CM | POA: Diagnosis not present

## 2023-01-17 DIAGNOSIS — E559 Vitamin D deficiency, unspecified: Secondary | ICD-10-CM

## 2023-01-17 MED ORDER — B-2-400 400 MG PO CAPS
1.0000 | ORAL_CAPSULE | Freq: Every day | ORAL | 2 refills | Status: DC
  Filled 2023-10-07: qty 30, 30d supply, fill #0

## 2023-01-17 MED ORDER — SUMATRIPTAN SUCCINATE 25 MG PO TABS
25.0000 mg | ORAL_TABLET | ORAL | 0 refills | Status: DC | PRN
  Filled 2023-01-21: qty 9, 15d supply, fill #0
  Filled 2023-10-07: qty 9, 15d supply, fill #1

## 2023-01-17 NOTE — Progress Notes (Signed)
New Patient Office Visit  Subjective:  Patient ID: Diane Robinson, female    DOB: [birth year]  Age: 22 y.o. MRN: [id number]  CC:  Chief Complaint  Patient presents with   Establish Care   Headache    Patient complains of headaches on the R back side of skull, radiating up to R eye. Happens several times a day, starting July 2023. Has taken sumatriptan in the past.     HPI Diane Robinson is a 22 y.o. female with past medical history of migraine headaches presents for establishing care. For the details of today's visit, please refer to the assessment and plan.     Past Medical History:  Diagnosis Date   Concussion with loss of consciousness 01/28/2015   Dizziness due to old head trauma 01/28/2015   Episodic tension-type headache, not intractable 09/13/2016   Headache    Postconcussion syndrome 01/28/2015   Posttraumatic headache 01/28/2015    History reviewed. No pertinent surgical history.  Family History  Problem Relation Age of Onset   Hypothyroidism Mother    Hypertension Mother    Colon cancer Maternal Grandmother    Cancer Paternal Grandmother    Breast cancer Paternal Grandmother     Social History   Socioeconomic History   Marital status: Single    Spouse name: Not on file   Number of children: Not on file   Years of education: Not on file   Highest education level: High school graduate  Occupational History   Not on file  Tobacco Use   Smoking status: Never   Smokeless tobacco: Never  Substance and Sexual Activity   Alcohol use: No   Drug use: No   Sexual activity: Never  Other Topics Concern   Not on file  Social History Narrative   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      Diet: eats all food groups   Caffeine: iced coffee once a month, soda weekly   Water: 1 bottles daily       Wear seat belt    Does not use phone while driving    Smoke detectors at home    No weapons    Social Determinants  of Health   Financial Resource Strain: Low Risk  (03/30/2020)   Overall Financial Resource Strain (CARDIA)    Difficulty of Paying Living Expenses: Not hard at all  Food Insecurity: No Food Insecurity (03/30/2020)   Hunger Vital Sign    Worried About Running Out of Food in the Last Year: Never true    [place] in the Last Year: Never true  Transportation Needs: No Transportation Needs (03/30/2020)   PRAPARE - Hydrologist (Medical): No    Lack of Transportation (Non-Medical): No  Physical Activity: Insufficiently Active (03/30/2020)   Exercise Vital Sign    Days of Exercise per Week: 1 day    Minutes of Exercise per Session: 20 min  Stress: No Stress Concern Present (03/30/2020)   [place]    Feeling of Stress : Not at all  Social Connections: Moderately Integrated (03/30/2020)   Social Connection and Isolation Panel [NHANES]    Frequency of Communication with Friends and Family: More than three times a week    Frequency of Social Gatherings with Friends and Family: Once a week    Attends Religious Services: More than  4 times per year    Active Member of Clubs or Organizations: Yes    Attends Archivist Meetings: Never    Marital Status: Never married  Intimate Partner Violence: Not At Risk (03/30/2020)   Humiliation, Afraid, Rape, and Kick questionnaire    Fear of Current or Ex-Partner: No    Emotionally Abused: No    Physically Abused: No    Sexually Abused: No    ROS Review of Systems  Constitutional:  Negative for chills and fever.  Eyes:  Negative for visual disturbance.  Respiratory:  Negative for chest tightness and shortness of breath.   Neurological:  Negative for dizziness and headaches.    Objective:   Today's Vitals: BP 117/74   Pulse 84   Ht '4\' 10"'$  (1.473 m)   Wt 127 lb (57.6 kg)   SpO2 98%   BMI 26.54 kg/m   Physical Exam HENT:     Head:  Normocephalic.     Mouth/Throat:     Mouth: Mucous membranes are moist.  Cardiovascular:     Rate and Rhythm: Normal rate.     Heart sounds: Normal heart sounds.  Pulmonary:     Effort: Pulmonary effort is normal.     Breath sounds: Normal breath sounds.  Neurological:     Mental Status: She is alert.      Assessment & Plan:   Migraine without aura and without status migrainosus, not intractable Assessment & Plan: History of migraine and tension headaches Per her chart review, the patient has been having tension and migraine headaches since having a concussion in March 2015 She used to take sumatriptan 25 mg for acute symptoms and reports not taking therapy over the past few years She reports a unilateral headache that starts at the right occipital and radiates to the right frontal above her right eyelid She reports having headaches 2-3 times daily She is unable to provide the duration of her headache Reports taking Motrin with relief of her headache sometimes Denies red flag symptoms of her headaches Will reinstate sumatriptan 25 mg today and start patient on riboflavin 400 mg daily for preventative treatment   Orders: -     SUMAtriptan Succinate; Take 1 tablet (25 mg total) by mouth every 2 (two) hours as needed for migraine. max 200 mg per 24-hour period  Dispense: 10 tablet; Refill: 0 -     Riboflavin; Take 1 capsule (400 mg total) by mouth daily.  Dispense: 30 capsule; Refill: 2  Vitamin D deficiency -     VITAMIN D 25 Hydroxy (Vit-D Deficiency, Fractures)  Other specified hypothyroidism -     TSH + free T4  Other hyperlipidemia -     CBC with Differential/Platelet -     CMP14+EGFR -     Lipid panel  IFG (impaired fasting glucose) -     Hemoglobin A1c     Follow-up: Return in about 1 month (around 02/17/2023) for pap smear.   Alvira Monday, FNP

## 2023-01-17 NOTE — Patient Instructions (Signed)
I appreciate the opportunity to provide care to you today!    Follow up:  1 month for pap smear  Labs: please stop by the lab today/ during the week to get your blood drawn (CBC, CMP, TSH, Lipid profile, HgA1c, Vit D)  MIGRAINE PREVENTION  LIFESTYLE CHANGES -Decrease or avoid caffeine / alcohol -Eat and sleep on a regular schedule -Exercise several times per week Supplements to prevent Migraines  Riboflavoin (vit B2) 400 mg daily MIGRAINE RESCUE  - Take sumatriptan's  25 mg at onset of migraine with a glass of liquid; Wait at least 2 hours between doses (max 200 mg per 24-hour period)    Please continue to a heart-healthy diet and increase your physical activities. Try to exercise for 66mns at least five times a week.      It was a pleasure to see you and I look forward to continuing to work together on your health and well-being. Please do not hesitate to call the office if you need care or have questions about your care.   Have a wonderful day and week. With Gratitude, GAlvira MondayMSN, FNP-BC

## 2023-01-17 NOTE — Assessment & Plan Note (Signed)
History of migraine and tension headaches Per her chart review, the patient has been having tension and migraine headaches since having a concussion in March 2015 She used to take sumatriptan 25 mg for acute symptoms and reports not taking therapy over the past few years She reports a unilateral headache that starts at the right occipital and radiates to the right frontal above her right eyelid She reports having headaches 2-3 times daily She is unable to provide the duration of her headache Reports taking Motrin with relief of her headache sometimes Denies red flag symptoms of her headaches Will reinstate sumatriptan 25 mg today and start patient on riboflavin 400 mg daily for preventative treatment

## 2023-01-21 ENCOUNTER — Other Ambulatory Visit (HOSPITAL_COMMUNITY): Payer: Self-pay

## 2023-01-22 ENCOUNTER — Ambulatory Visit: Payer: 59 | Admitting: Family Medicine

## 2023-01-22 ENCOUNTER — Other Ambulatory Visit (HOSPITAL_COMMUNITY): Payer: Self-pay

## 2023-02-18 ENCOUNTER — Ambulatory Visit: Payer: 59 | Admitting: Family Medicine

## 2023-02-25 ENCOUNTER — Encounter: Payer: Self-pay | Admitting: Family Medicine

## 2023-03-12 ENCOUNTER — Other Ambulatory Visit (HOSPITAL_COMMUNITY): Payer: Self-pay

## 2023-10-08 ENCOUNTER — Other Ambulatory Visit: Payer: Self-pay | Admitting: Family Medicine

## 2023-10-08 ENCOUNTER — Other Ambulatory Visit (HOSPITAL_COMMUNITY): Payer: Self-pay

## 2023-10-08 DIAGNOSIS — G43009 Migraine without aura, not intractable, without status migrainosus: Secondary | ICD-10-CM

## 2023-10-08 MED ORDER — SUMATRIPTAN SUCCINATE 25 MG PO TABS
25.0000 mg | ORAL_TABLET | ORAL | 0 refills | Status: DC | PRN
  Filled 2023-10-08: qty 9, 18d supply, fill #0

## 2023-10-09 ENCOUNTER — Other Ambulatory Visit (HOSPITAL_COMMUNITY): Payer: Self-pay

## 2023-10-12 ENCOUNTER — Other Ambulatory Visit (HOSPITAL_COMMUNITY): Payer: Self-pay

## 2023-11-14 ENCOUNTER — Other Ambulatory Visit (HOSPITAL_COMMUNITY): Payer: Self-pay

## 2023-11-14 ENCOUNTER — Telehealth: Payer: Commercial Managed Care - PPO | Admitting: Nurse Practitioner

## 2023-11-14 ENCOUNTER — Other Ambulatory Visit (HOSPITAL_BASED_OUTPATIENT_CLINIC_OR_DEPARTMENT_OTHER): Payer: Self-pay

## 2023-11-14 DIAGNOSIS — M545 Low back pain, unspecified: Secondary | ICD-10-CM | POA: Diagnosis not present

## 2023-11-14 MED ORDER — CYCLOBENZAPRINE HCL 5 MG PO TABS
5.0000 mg | ORAL_TABLET | Freq: Every evening | ORAL | 0 refills | Status: AC | PRN
  Filled 2023-11-14: qty 10, 10d supply, fill #0

## 2023-11-14 MED ORDER — NAPROXEN 500 MG PO TABS
500.0000 mg | ORAL_TABLET | Freq: Two times a day (BID) | ORAL | 0 refills | Status: DC
  Filled 2023-11-14: qty 60, 30d supply, fill #0

## 2023-11-14 NOTE — Progress Notes (Signed)
 E-Visit for Back Pain   We are sorry that you are not feeling well.  Here is how we plan to help!  Based on what you have shared with me it looks like you mostly have acute back pain.  Acute back pain is defined as musculoskeletal pain that can resolve in 1-3 weeks with conservative treatment.  I have prescribed Naprosyn  500 mg take one by mouth twice a day non-steroid anti-inflammatory (NSAID) as well as Flexeril  before bed as needed which is a muscle relaxer  Some patients experience stomach irritation or in increased heartburn with anti-inflammatory drugs.  Please keep in mind that muscle relaxer's can cause fatigue and should not be taken while at work or driving.  Back pain is very common.  The pain often gets better over time.  The cause of back pain is usually not dangerous.  Most people can learn to manage their back pain on their own.  Home Care Stay active.  Start with short walks on flat ground if you can.  Try to walk farther each day. Do not sit, drive or stand in one place for more than 30 minutes.  Do not stay in bed. Do not avoid exercise or work.  Activity can help your back heal faster. Be careful when you bend or lift an object.  Bend at your knees, keep the object close to you, and do not twist. Sleep on a firm mattress.  Lie on your side, and bend your knees.  If you lie on your back, put a pillow under your knees. Only take medicines as told by your doctor. Put ice on the injured area. Put ice in a plastic bag Place a towel between your skin and the bag Leave the ice on for 15-20 minutes, 3-4 times a day for the first 2-3 days. 210 After that, you can switch between ice and heat packs. Ask your doctor about back exercises or massage. Avoid feeling anxious or stressed.  Find good ways to deal with stress, such as exercise.  Get Help Right Way If: Your pain does not go away with rest or medicine. Your pain does not go away in 1 week. You have new problems. You do not  feel well. The pain spreads into your legs. You cannot control when you poop (bowel movement) or pee (urinate) You feel sick to your stomach (nauseous) or throw up (vomit) You have belly (abdominal) pain. You feel like you may pass out (faint). If you develop a fever.  Make Sure you: Understand these instructions. Will watch your condition Will get help right away if you are not doing well or get worse.  Your e-visit answers were reviewed by a board certified advanced clinical practitioner to complete your personal care plan.  Depending on the condition, your plan could have included both over the counter or prescription medications.  If there is a problem please reply  once you have received a response from your provider.  Your safety is important to us .  If you have drug allergies check your prescription carefully.    You can use MyChart to ask questions about today's visit, request a non-urgent call back, or ask for a work or school excuse for 24 hours related to this e-Visit. If it has been greater than 24 hours you will need to follow up with your provider, or enter a new e-Visit to address those concerns.  You will get an e-mail in the next two days asking about your experience.  I hope that your e-visit has been valuable and will speed your recovery. Thank you for using e-visits.   I spent approximately 5 minutes reviewing the patient's history, current symptoms and coordinating their care today.

## 2023-11-15 ENCOUNTER — Other Ambulatory Visit (HOSPITAL_COMMUNITY): Payer: Self-pay

## 2023-11-27 ENCOUNTER — Other Ambulatory Visit: Payer: Self-pay | Admitting: Nurse Practitioner

## 2023-11-29 ENCOUNTER — Other Ambulatory Visit (HOSPITAL_COMMUNITY): Payer: Self-pay

## 2023-11-29 ENCOUNTER — Other Ambulatory Visit: Payer: Self-pay

## 2023-11-29 MED ORDER — ERGOCALCIFEROL 1.25 MG (50000 UT) PO CAPS
50000.0000 [IU] | ORAL_CAPSULE | ORAL | 0 refills | Status: DC
  Filled 2023-11-29: qty 8, 56d supply, fill #0

## 2024-02-03 ENCOUNTER — Other Ambulatory Visit: Payer: Self-pay

## 2024-02-13 ENCOUNTER — Telehealth (INDEPENDENT_AMBULATORY_CARE_PROVIDER_SITE_OTHER): Admitting: Family Medicine

## 2024-02-13 ENCOUNTER — Ambulatory Visit: Admitting: Family Medicine

## 2024-02-13 DIAGNOSIS — Z638 Other specified problems related to primary support group: Secondary | ICD-10-CM

## 2024-02-13 DIAGNOSIS — G43009 Migraine without aura, not intractable, without status migrainosus: Secondary | ICD-10-CM

## 2024-02-13 MED ORDER — NURTEC 75 MG PO TBDP
ORAL_TABLET | ORAL | 1 refills | Status: DC
  Filled 2024-02-15: qty 18, 36d supply, fill #0
  Filled 2024-02-20: qty 18, 30d supply, fill #0
  Filled 2024-02-20 – 2024-02-24 (×3): qty 9, 30d supply, fill #0

## 2024-02-13 MED ORDER — SUMATRIPTAN SUCCINATE 50 MG PO TABS
50.0000 mg | ORAL_TABLET | ORAL | 0 refills | Status: DC | PRN
  Filled 2024-02-15 – 2024-02-20 (×2): qty 10, 18d supply, fill #0

## 2024-02-13 NOTE — Progress Notes (Addendum)
 Virtual Visit via Video Note  I connected with Diane Robinson on 02/13/24 at  3:40 PM EDT by a video enabled telemedicine application and verified that I am speaking with the correct person using two identifiers.  Patient Location: Home Provider Location: Office/Clinic  I discussed the limitations, risks, security, and privacy concerns of performing an evaluation and management service by video and the availability of in person appointments. I also discussed with the patient that there may be a patient responsible charge related to this service. The patient expressed understanding and agreed to proceed.  Subjective: PCP: Tyjanae Bartek, FNP  No chief complaint on file.  HPI The patient reports experiencing approximately 8 migraine episodes per month. Her most recent migraine occurred four days ago and lasted for two days. She reports taking Sumatriptan  25 mg every two hours as needed, with some relief. No red flag symptoms were reported. The patient is requesting to speak with a counselor regarding ongoing family conflicts but is not ready to discuss the details at this time. No suicidal thoughts or ideation were reported.   ROS: Per HPI  Current Outpatient Medications:    Rimegepant Sulfate  (NURTEC) 75 MG TBDP, Take 1 tablet (75 mg total) every other day as a preventive treatment for migraines, Disp: 18 tablet, Rfl: 1   SUMAtriptan  (IMITREX ) 50 MG tablet, Take 1 tablet (50 mg total) by mouth every 2 (two) hours as needed for migraine. May repeat in 2 hours if headache persists or recurs., Disp: 10 tablet, Rfl: 0   ergocalciferol  (VITAMIN D2) 1.25 MG (50000 UT) capsule, Take 1 capsule (50,000 Units total) by mouth every 7 (seven) days., Disp: 8 capsule, Rfl: 0   naproxen  (NAPROSYN ) 500 MG tablet, Take 1 tablet (500 mg total) by mouth 2 (two) times daily with a meal., Disp: 60 tablet, Rfl: 0   Riboflavin  (B-2-400) 400 MG CAPS, Take 1 capsule (400 mg total) by mouth daily., Disp: 30  capsule, Rfl: 2  Observations/Objective: There were no vitals filed for this visit. Physical Exam Patient is well-developed, well-nourished in no acute distress.  Resting comfortably at home.  Head is normocephalic, atraumatic.  No labored breathing.  Speech is clear and coherent with logical content.  Patient is alert and oriented at baseline.   Assessment and Plan: Migraine without aura and without status migrainosus, not intractable -     SUMAtriptan  Succinate; Take 1 tablet (50 mg total) by mouth every 2 (two) hours as needed for migraine. May repeat in 2 hours if headache persists or recurs.  Dispense: 10 tablet; Refill: 0 -     Nurtec; Take 1 tablet (75 mg total) every other day as a preventive treatment for migraines  Dispense: 18 tablet; Refill: 1  Family conflict -     Amb ref to Integrated Behavioral Health  Patient and/or legal guardian verbally consented to [facility] services about presenting concerns and psychiatric consultation as appropriate.  The services will be billed as appropriate for the patient  Note: This chart has been completed using Engineer, civil (consulting) software, and while attempts have been made to ensure accuracy, certain words and phrases may not be transcribed as intended.    Follow Up Instructions: Return in about 6 weeks (around 03/26/2024).   I discussed the assessment and treatment plan with the patient. The patient was provided an opportunity to ask questions, and all were answered. The patient agreed with the plan and demonstrated an understanding of the instructions.   The  patient was advised to call back or seek an in-person evaluation if the symptoms worsen or if the condition fails to improve as anticipated.  The above assessment and management plan was discussed with the patient. The patient verbalized understanding of and has agreed to the management plan.   Diane Diffee, FNP

## 2024-02-15 ENCOUNTER — Other Ambulatory Visit (HOSPITAL_COMMUNITY): Payer: Self-pay

## 2024-02-17 ENCOUNTER — Telehealth: Payer: Self-pay | Admitting: Pharmacy Technician

## 2024-02-17 ENCOUNTER — Other Ambulatory Visit (HOSPITAL_COMMUNITY): Payer: Self-pay

## 2024-02-17 NOTE — Telephone Encounter (Signed)
 Pharmacy Patient Advocate Encounter   Received notification from CoverMyMeds that prior authorization for Nurtec 75MG  dispersible tablets is required/requested.   Insurance verification completed.   The patient is insured through [facility] .   Per test claim: PA required; PA submitted to above mentioned insurance via CoverMyMeds Key/confirmation #/EOC [id number] Status is pending

## 2024-02-18 ENCOUNTER — Other Ambulatory Visit (HOSPITAL_COMMUNITY): Payer: Self-pay

## 2024-02-18 NOTE — Telephone Encounter (Signed)
 Pharmacy Patient Advocate Encounter  Received notification from [facility] that Prior Authorization for Nurtec 75MG  dispersible tablets has been APPROVED from 02/18/2024 to 08/16/2024. Ran test claim, Copay is $250.00. This test claim was processed through [facility]- copay amounts may vary at other pharmacies due to pharmacy/plan contracts, or as the patient moves through the different stages of their insurance plan.   PA #/Case ID/Reference #: [id number]   COPAY IS $0.00 AFTER E-VOUCHER.

## 2024-02-20 ENCOUNTER — Other Ambulatory Visit (HOSPITAL_COMMUNITY): Payer: Self-pay

## 2024-02-24 ENCOUNTER — Other Ambulatory Visit: Payer: Self-pay

## 2024-02-24 ENCOUNTER — Other Ambulatory Visit (HOSPITAL_COMMUNITY): Payer: Self-pay

## 2024-03-06 ENCOUNTER — Ambulatory Visit: Payer: Self-pay | Admitting: Professional Counselor

## 2024-03-06 DIAGNOSIS — Z638 Other specified problems related to primary support group: Secondary | ICD-10-CM

## 2024-03-10 NOTE — BH Specialist Note (Signed)
 Collaborative Care Initial Assessment  Session Start time: 1:00 pm   Session End time: 1:30 pm  Total time in minutes: 60 min   Type of Contact: Virtual Patient Location: Home [facility] Location: office Patient consent obtained:  Yes Types of Service: Collaborative care  Summary  Patient is a 23 yo female being referred to collaborative care by her pcp per her requestion for family problems. Patient was engaged and cooperative during session.    Reason for referral in patient/family's own words:  "Problems with family communication"  Patient's goal for today's visit: "Seeking therapy"  History of Present illness:   The patient presented for a collaborative care assessment via telehealth, reporting ongoing family issues and a sense of communication breakdown at home. She currently lives with her mother, father, and sisters, and reached out to her primary care provider seeking support.  During the assessment, the patient denied symptoms of depression or anxiety. Her screening scores were PHQ-9 = 0 and GAD-7 = 0. She appears to be functioning well in her daily life and is not experiencing any emotional or behavioral symptoms that would benefit from collaborative care intervention. She expressed interest in receiving counseling to help her navigate the family dynamics but clarified she is not seeking medication or structured behavioral health treatment for a mental health condition.  The behavioral counselor explained the scope of collaborative care, including its focus on brief interventions for mild to moderate depression and anxiety and the role of medication management. Based on the patient's goals and presentation, collaborative care was determined not to be an appropriate fit.  A referral to a traditional outpatient therapist in her area was provided. This patient will be signed off from the collaborative care program.   Goals:  Improve family communication   Interventions:  Motivational Interviewing   Follow-up Plan:  Refer to family therapy

## 2024-03-10 NOTE — Patient Instructions (Addendum)
 [facility]  [address] [place], [place] [address] map  [phone]  [email or web address]

## 2024-03-11 ENCOUNTER — Telehealth (INDEPENDENT_AMBULATORY_CARE_PROVIDER_SITE_OTHER): Payer: Self-pay | Admitting: Professional Counselor

## 2024-03-11 DIAGNOSIS — Z638 Other specified problems related to primary support group: Secondary | ICD-10-CM | POA: Diagnosis not present

## 2024-03-11 NOTE — BH Specialist Note (Signed)
 Virtual Behavioral Health Treatment Plan Team Note  MRN: [id number] NAME: Diane Robinson  DATE: 03/13/24  Start time: Start Time: 0950 End time: Stop Time: 0955 Total time: Total Time in Minutes (Visit): 5  Total number of Virtual BH Treatment Team Plan encounters: 1/4  Treatment Team Attendees: Dr. Matias Soman and Delrae Field Attestation signed by Elesa Grills, MD at 03/11/2024  9:50 AM   Collaborative Care Psychiatric Consultant Case Review    Assessment/Provisional Diagnosis Diane Robinson is a 23 y.o. year old female with no known past medical hx. The patient is referred for "communication problems".  Scored zero n gad-7 and PHQ9. Wants to do therapy for communication with family. No reported depression or anxiety.    Recommendation  Refer to therapy.  BH specialist to follow up.     Thank you for your consult. We will continue to follow the patient. Please contact our collaborative care team for any questions or concerns.    I spent 15 minutes reviewing the chart, discussing with Mr. Toni Frank, and documenting in the chart.   Diagnoses:    ICD-10-CM   1. Family conflict  Z63.8       Goals, Interventions and Follow-up Plan Goals: Improve family communication Interventions: Motivational Interviewing Medication Management Recommendations: NA Follow-up Plan: Refer to family therapy  History of the present illness Presenting Problem/Current Symptoms:  The patient presented for a collaborative care assessment via telehealth, reporting ongoing family issues and a sense of communication breakdown at home. She currently lives with her mother, father, and sisters, and reached out to her primary care provider seeking support.   During the assessment, the patient denied symptoms of depression or anxiety. Her screening scores were PHQ-9 = 0 and GAD-7 = 0. She appears to be functioning well in her daily life and is not experiencing any emotional or behavioral symptoms that  would benefit from collaborative care intervention. She expressed interest in receiving counseling to help her navigate the family dynamics but clarified she is not seeking medication or structured behavioral health treatment for a mental health condition.   The behavioral counselor explained the scope of collaborative care, including its focus on brief interventions for mild to moderate depression and anxiety and the role of medication management. Based on the patient's goals and presentation, collaborative care was determined not to be an appropriate fit.   A referral to a traditional outpatient therapist in her area was provided. This patient will be signed off from the collaborative care program.  Screenings PHQ-9 Assessments:     01/17/2023   10:48 AM 05/18/2021    2:23 PM 03/30/2020    9:21 AM  Depression screen PHQ 2/9  Decreased Interest 0 0 0  Down, Depressed, Hopeless 0 0 0  PHQ - 2 Score 0 0 0  Altered sleeping 0 0 0  Tired, decreased energy 0 0 0  Change in appetite 0 0 0  Feeling bad or failure about yourself  0 0 0  Trouble concentrating 0 0 0  Moving slowly or fidgety/restless 0 0 0  Suicidal thoughts 0 0 0  PHQ-9 Score 0 0 0  Difficult doing work/chores Not difficult at all  Not difficult at all   GAD-7 Assessments:     01/17/2023   10:48 AM 03/30/2020    9:22 AM  GAD 7 : Generalized Anxiety Score  Nervous, Anxious, on Edge 0 0  Control/stop worrying 0 0  Worry too much - different things 0 0  Trouble  relaxing 0 0  Restless 0 0  Easily annoyed or irritable 0 0  Afraid - awful might happen 0 0  Total GAD 7 Score 0 0  Anxiety Difficulty Not difficult at all Not difficult at all    Past Medical History Past Medical History:  Diagnosis Date   Concussion with loss of consciousness 01/28/2015   Dizziness due to old head trauma 01/28/2015   Episodic tension-type headache, not intractable 09/13/2016   Headache    Postconcussion syndrome 01/28/2015   Posttraumatic  headache 01/28/2015    Vital signs: There were no vitals filed for this visit.  Allergies:  Allergies as of 03/11/2024   (No Known Allergies)    Medication History Current medications:  Outpatient Encounter Medications as of 03/11/2024  Medication Sig   ergocalciferol  (VITAMIN D2) 1.25 MG (50000 UT) capsule Take 1 capsule (50,000 Units total) by mouth every 7 (seven) days.   naproxen  (NAPROSYN ) 500 MG tablet Take 1 tablet (500 mg total) by mouth 2 (two) times daily with a meal.   Riboflavin  (B-2-400) 400 MG CAPS Take 1 capsule (400 mg total) by mouth daily.   Rimegepant Sulfate  (NURTEC) 75 MG TBDP Take 1 tablet (75 mg total) every other day as a preventive treatment for migraines   SUMAtriptan  (IMITREX ) 50 MG tablet Take 1 tablet (50 mg total) by mouth every 2 (two) hours as needed for migraine. May repeat in 2 hours if headache persists or recurs. (Max 200 mg per 24 hour period.)   No facility-administered encounter medications on file as of 03/11/2024.     Scribe for Treatment Team: Regina Capes

## 2024-04-20 ENCOUNTER — Ambulatory Visit: Admitting: Family Medicine

## 2024-04-29 DIAGNOSIS — F411 Generalized anxiety disorder: Secondary | ICD-10-CM | POA: Diagnosis not present

## 2024-05-07 ENCOUNTER — Telehealth (INDEPENDENT_AMBULATORY_CARE_PROVIDER_SITE_OTHER): Admitting: Family Medicine

## 2024-05-07 ENCOUNTER — Ambulatory Visit: Admitting: Family Medicine

## 2024-05-07 ENCOUNTER — Other Ambulatory Visit (HOSPITAL_COMMUNITY): Payer: Self-pay

## 2024-05-07 DIAGNOSIS — G43009 Migraine without aura, not intractable, without status migrainosus: Secondary | ICD-10-CM

## 2024-05-07 MED ORDER — SUMATRIPTAN SUCCINATE 50 MG PO TABS
50.0000 mg | ORAL_TABLET | ORAL | 0 refills | Status: DC | PRN
  Filled 2024-05-07 (×2): qty 10, 30d supply, fill #0

## 2024-05-07 MED ORDER — NURTEC 75 MG PO TBDP
75.0000 mg | ORAL_TABLET | ORAL | 1 refills | Status: DC
  Filled 2024-05-07: qty 16, 32d supply, fill #0
  Filled 2024-06-27: qty 16, 32d supply, fill #1

## 2024-05-07 NOTE — Progress Notes (Signed)
 Virtual Visit via Video Note  I connected with Diane Robinson on 05/07/24 at  9:00 AM EDT by a video enabled telemedicine application and verified that I am speaking with the correct person using two identifiers.  Patient Location: Home Provider Location: Office/Clinic  I discussed the limitations, risks, security, and privacy concerns of performing an evaluation and management service by video and the availability of in person appointments. I also discussed with the patient that there may be a patient responsible charge related to this service. The patient expressed understanding and agreed to proceed.  Subjective: PCP: Denelle Capurro, FNP  Chief Complaint  Patient presents with   Follow-up    On headaches. Meds working well    HPI The patient is seen today for follow-up for migraine headaches. She reports doing well on her current treatment regimen, which includes Nurtec 75 mg every other day for preventive therapy and Sumatriptan  50 mg as needed for abortive treatment. She experiences migraines approximately once every two weeks, which she describes as tolerable. She denies any side effects or adverse reactions to the medications. The patient is requesting a refill today.   ROS: Per HPI  Current Outpatient Medications:    ergocalciferol  (VITAMIN D2) 1.25 MG (50000 UT) capsule, Take 1 capsule (50,000 Units total) by mouth every 7 (seven) days. (Patient not taking: Reported on 05/07/2024), Disp: 8 capsule, Rfl: 0   naproxen  (NAPROSYN ) 500 MG tablet, Take 1 tablet (500 mg total) by mouth 2 (two) times daily with a meal. (Patient not taking: Reported on 05/07/2024), Disp: 60 tablet, Rfl: 0   Riboflavin  (B-2-400) 400 MG CAPS, Take 1 capsule (400 mg total) by mouth daily. (Patient not taking: Reported on 05/07/2024), Disp: 30 capsule, Rfl: 2   Rimegepant Sulfate  (NURTEC) 75 MG TBDP, Take 1 tablet (75 mg total) every other day as a preventive treatment for migraines, Disp: 18 tablet,  Rfl: 1   SUMAtriptan  (IMITREX ) 50 MG tablet, Take 1 tablet (50 mg total) by mouth every 2 (two) hours as needed for migraine. May repeat in 2 hours if headache persists or recurs. (Max 200 mg per 24 hour period.), Disp: 10 tablet, Rfl: 0  Observations/Objective: There were no vitals filed for this visit. Physical Exam Patient is well-developed, well-nourished in no acute distress.  Resting comfortably at home.  Head is normocephalic, atraumatic.  No labored breathing.  Speech is clear and coherent with logical content.  Patient is alert and oriented at baseline.   Assessment and Plan: Migraine without aura and without status migrainosus, not intractable -     SUMAtriptan  Succinate; Take 1 tablet (50 mg total) by mouth every 2 (two) hours as needed for migraine. May repeat in 2 hours if headache persists or recurs. (Max 200 mg per 24 hour period.)  Dispense: 10 tablet; Refill: 0 -     Nurtec; Take 1 tablet (75 mg total) every other day as a preventive treatment for migraines  Dispense: 18 tablet; Refill: 1    Follow Up Instructions: Return in about 4 months (around 09/06/2024).   I discussed the assessment and treatment plan with the patient. The patient was provided an opportunity to ask questions, and all were answered. The patient agreed with the plan and demonstrated an understanding of the instructions.   The patient was advised to call back or seek an in-person evaluation if the symptoms worsen or if the condition fails to improve as anticipated.  The above assessment and management plan was discussed with the  patient. The patient verbalized understanding of and has agreed to the management plan.   Loda Bialas, FNP

## 2024-05-20 DIAGNOSIS — F411 Generalized anxiety disorder: Secondary | ICD-10-CM | POA: Diagnosis not present

## 2024-05-27 DIAGNOSIS — F411 Generalized anxiety disorder: Secondary | ICD-10-CM | POA: Diagnosis not present

## 2024-06-10 DIAGNOSIS — F411 Generalized anxiety disorder: Secondary | ICD-10-CM | POA: Diagnosis not present

## 2024-06-17 DIAGNOSIS — F411 Generalized anxiety disorder: Secondary | ICD-10-CM | POA: Diagnosis not present

## 2024-06-24 DIAGNOSIS — F411 Generalized anxiety disorder: Secondary | ICD-10-CM | POA: Diagnosis not present

## 2024-06-27 ENCOUNTER — Telehealth: Admitting: Family Medicine

## 2024-06-27 DIAGNOSIS — M545 Low back pain, unspecified: Secondary | ICD-10-CM | POA: Diagnosis not present

## 2024-06-27 MED ORDER — CYCLOBENZAPRINE HCL 10 MG PO TABS: 10.0000 mg | ORAL_TABLET | Freq: Three times a day (TID) | ORAL | 0 refills | Status: AC | PRN

## 2024-06-27 MED ORDER — NAPROXEN 500 MG PO TABS: 500.0000 mg | ORAL_TABLET | Freq: Two times a day (BID) | ORAL | 0 refills | Status: AC

## 2024-06-27 NOTE — Progress Notes (Signed)
 E-Visit for Back Pain   We are sorry that you are not feeling well.  Here is how we plan to help!  Based on what you have shared with me it looks like you mostly have acute back pain.  Acute back pain is defined as musculoskeletal pain that can resolve in 1-3 weeks with conservative treatment.  I have prescribed Naprosyn 500 mg take one by mouth twice a day non-steroid anti-inflammatory (NSAID) as well as Flexeril 10 mg every eight hours as needed which is a muscle relaxer  Some patients experience stomach irritation or in increased heartburn with anti-inflammatory drugs.  Please keep in mind that muscle relaxer's can cause fatigue and should not be taken while at work or driving.  Back pain is very common.  The pain often gets better over time.  The cause of back pain is usually not dangerous.  Most people can learn to manage their back pain on their own.  Home Care Stay active.  Start with short walks on flat ground if you can.  Try to walk farther each day. Do not sit, drive or stand in one place for more than 30 minutes.  Do not stay in bed. Do not avoid exercise or work.  Activity can help your back heal faster. Be careful when you bend or lift an object.  Bend at your knees, keep the object close to you, and do not twist. Sleep on a firm mattress.  Lie on your side, and bend your knees.  If you lie on your back, put a pillow under your knees. Only take medicines as told by your doctor. Put ice on the injured area. Put ice in a plastic bag Place a towel between your skin and the bag Leave the ice on for 15-20 minutes, 3-4 times a day for the first 2-3 days. 210 After that, you can switch between ice and heat packs. Ask your doctor about back exercises or massage. Avoid feeling anxious or stressed.  Find good ways to deal with stress, such as exercise.  Get Help Right Way If: Your pain does not go away with rest or medicine. Your pain does not go away in 1 week. You have new  problems. You do not feel well. The pain spreads into your legs. You cannot control when you poop (bowel movement) or pee (urinate) You feel sick to your stomach (nauseous) or throw up (vomit) You have belly (abdominal) pain. You feel like you may pass out (faint). If you develop a fever.  Make Sure you: Understand these instructions. Will watch your condition Will get help right away if you are not doing well or get worse.  Your e-visit answers were reviewed by a board certified advanced clinical practitioner to complete your personal care plan.  Depending on the condition, your plan could have included both over the counter or prescription medications.  If there is a problem please reply  once you have received a response from your provider.  Your safety is important to us .  If you have drug allergies check your prescription carefully.    You can use MyChart to ask questions about today's visit, request a non-urgent call back, or ask for a work or school excuse for 24 hours related to this e-Visit. If it has been greater than 24 hours you will need to follow up with your provider, or enter a new e-Visit to address those concerns.  You will get an e-mail in the next two days asking about  your experience.  I hope that your e-visit has been valuable and will speed your recovery. Thank you for using e-visits.  I have spent 5 minutes in review of e-visit questionnaire, review and updating patient chart, medical decision making and response to patient.   Roosvelt Mater, PA-C

## 2024-07-01 DIAGNOSIS — F411 Generalized anxiety disorder: Secondary | ICD-10-CM | POA: Diagnosis not present

## 2024-07-08 DIAGNOSIS — F411 Generalized anxiety disorder: Secondary | ICD-10-CM | POA: Diagnosis not present

## 2024-07-15 DIAGNOSIS — F411 Generalized anxiety disorder: Secondary | ICD-10-CM | POA: Diagnosis not present

## 2024-08-04 ENCOUNTER — Other Ambulatory Visit (HOSPITAL_COMMUNITY): Payer: Self-pay

## 2024-08-04 ENCOUNTER — Telehealth: Payer: Self-pay | Admitting: Pharmacy Technician

## 2024-08-04 DIAGNOSIS — F411 Generalized anxiety disorder: Secondary | ICD-10-CM | POA: Diagnosis not present

## 2024-08-04 NOTE — Telephone Encounter (Signed)
 Pharmacy Patient Advocate Encounter   Received notification from CoverMyMeds that prior authorization for Nurtec 75MG  dispersible tablets is due for renewal.   Insurance verification completed.   The patient is insured through [facility].  Action: PA required; PA submitted to above mentioned insurance via Latent Key/confirmation #/EOC [id number] Status is pending

## 2024-08-06 NOTE — Telephone Encounter (Signed)
 Pharmacy Patient Advocate Encounter  Received notification from [facility] that Prior Authorization for Nurtec 75MG  dispersible tablets has been APPROVED from 08/04/2024 to 08/03/2025   PA #/Case ID/Reference #: [id number]

## 2024-08-12 DIAGNOSIS — F411 Generalized anxiety disorder: Secondary | ICD-10-CM | POA: Diagnosis not present

## 2024-08-19 DIAGNOSIS — F411 Generalized anxiety disorder: Secondary | ICD-10-CM | POA: Diagnosis not present

## 2024-09-02 DIAGNOSIS — F411 Generalized anxiety disorder: Secondary | ICD-10-CM | POA: Diagnosis not present

## 2024-09-09 DIAGNOSIS — F411 Generalized anxiety disorder: Secondary | ICD-10-CM | POA: Diagnosis not present

## 2024-09-16 DIAGNOSIS — F411 Generalized anxiety disorder: Secondary | ICD-10-CM | POA: Diagnosis not present

## 2024-09-18 ENCOUNTER — Encounter (HOSPITAL_COMMUNITY): Payer: Self-pay

## 2024-09-18 ENCOUNTER — Ambulatory Visit (HOSPITAL_COMMUNITY)
Admission: RE | Admit: 2024-09-18 | Discharge: 2024-09-18 | Disposition: A | Discharge status: Home / Self Care | Source: Ambulatory Visit | Attending: Nurse Practitioner | Admitting: Nurse Practitioner

## 2024-09-18 ENCOUNTER — Ambulatory Visit (INDEPENDENT_AMBULATORY_CARE_PROVIDER_SITE_OTHER)

## 2024-09-18 VITALS — BP 126/85 | HR 93 | Temp 99.6°F | Resp 16 | Ht <= 58 in | Wt 121.0 lb

## 2024-09-18 DIAGNOSIS — G8929 Other chronic pain: Secondary | ICD-10-CM

## 2024-09-18 DIAGNOSIS — M79672 Pain in left foot: Secondary | ICD-10-CM

## 2024-09-18 NOTE — ED Triage Notes (Signed)
 I dropped a weight on my foot about 2 years ago. Occasionally I'll have some pain doing certain exercises. I was hoping to get it checked out and an x ray. - Entered by patient.   Patient states current pain ongoing for a few months. No changes in the pain. Denies any new injuries or strain. States the pain is in the left foot.   Has not taken any meds for current symptoms.

## 2024-09-18 NOTE — Discharge Instructions (Addendum)
 Your foot x-ray today did not show any fractures or bone injury. The pain you are experiencing is likely from soft tissue strain or irritation. You may continue normal daily activities as tolerated, but if certain movements or exercises cause pain, it's best to stop and rest your foot. Wrapping your foot with an ace bandage before exercise can provide extra support, and applying ice for about 20 minutes after activity can help reduce discomfort or swelling. Over-the-counter pain relievers such as Tylenol  or ibuprofen  can be used as directed if needed for soreness. If your pain continues for more than a couple of weeks, or if you notice swelling, tenderness, or difficulty walking, follow up with your primary care provider or an orthopedic specialist. They may recommend physical therapy or an MRI to check for ligament or tendon problems that don't show up on an x-ray. Go to the emergency department if you develop sudden severe pain, significant swelling, bruising, numbness, or if you are unable to bear weight on your foot.

## 2024-09-18 NOTE — ED Provider Notes (Signed)
 [facility]    CSN: [id number] Arrival date & time: 09/18/24  9081      History   Chief Complaint Chief Complaint  Patient presents with   Foot Pain   Appointment    0930    HPI Diane Robinson is a 23 y.o. female.   Discussed the use of AI scribe software for clinical note transcription with the patient, who gave verbal consent to proceed.   Patient presents with left foot pain that has been intermittent since dropping a weight on the top of her left foot approximately 2 years ago. She never sought initial medical evaluation at the time of injury. The pain is located around the top of her foot and occurs intermittently, particularly with exercise. When she experiences pain during exercise, it typically lasts for a few minutes after stopping the activity. She reports occasional swelling of the foot. She denies current pain at rest, numbness, tingling, or current bruising or discoloration, though notes there was some discoloration initially when the injury occurred. The pain does not appear to significantly impact her walking or exercise routine.   The following sections of the patient's history were reviewed and updated as appropriate: allergies, current medications, past family history, past medical history, past social history, past surgical history, and problem list.     Past Medical History:  Diagnosis Date   Concussion with loss of consciousness 01/28/2015   Dizziness due to old head trauma 01/28/2015   Episodic tension-type headache, not intractable 09/13/2016   Headache    Postconcussion syndrome 01/28/2015   Posttraumatic headache 01/28/2015    Patient Active Problem List   Diagnosis Date Noted   Family history of breast cancer 03/30/2020   Family history of BRCA gene mutation 03/30/2020   Family history of hypothyroidism 03/30/2020   Family history of colon cancer 03/30/2020   Migraine without aura and without status migrainosus, not intractable  09/13/2016    History reviewed. No pertinent surgical history.  OB History   No obstetric history on file.      Home Medications    Prior to Admission medications   Not on File    Family History Family History  Problem Relation Age of Onset   Hypothyroidism Mother    Hypertension Mother    Colon cancer Maternal Grandmother    Cancer Paternal Grandmother    Breast cancer Paternal Grandmother     Social History Social History   Tobacco Use   Smoking status: Never   Smokeless tobacco: Never  Substance Use Topics   Alcohol use: No   Drug use: No     Allergies   Patient has no known allergies.   Review of Systems Review of Systems  Musculoskeletal:  Positive for arthralgias (left foot pain). Negative for gait problem and joint swelling.  Neurological:  Negative for weakness and numbness.  All other systems reviewed and are negative.    Physical Exam Triage Vital Signs ED Triage Vitals  Encounter Vitals Group     BP 09/18/24 0942 126/85     Girls Systolic BP Percentile --      Girls Diastolic BP Percentile --      Boys Systolic BP Percentile --      Boys Diastolic BP Percentile --      Pulse Rate 09/18/24 0942 93     Resp 09/18/24 0942 16     Temp 09/18/24 0942 99.6 F (37.6 C)     Temp Source 09/18/24 0942 Oral  SpO2 09/18/24 0942 99 %     Weight 09/18/24 0942 121 lb (54.9 kg)     Height 09/18/24 0942 4' 10 (1.473 m)     Head Circumference --      Peak Flow --      Pain Score 09/18/24 0941 5     Pain Loc --      Pain Education --      Exclude from Growth Chart --    No data found.  Updated Vital Signs BP 126/85 (BP Location: Right Arm)   Pulse 93   Temp 99.6 F (37.6 C) (Oral)   Resp 16   Ht 4' 10 (1.473 m)   Wt 121 lb (54.9 kg)   LMP 09/14/2024 (Exact Date)   SpO2 99%   BMI 25.29 kg/m   Visual Acuity Right Eye Distance:   Left Eye Distance:   Bilateral Distance:    Right Eye Near:   Left Eye Near:    Bilateral Near:      Physical Exam Vitals reviewed.  Constitutional:      General: She is awake. She is not in acute distress.    Appearance: Normal appearance. She is well-developed. She is not ill-appearing, toxic-appearing or diaphoretic.  HENT:     Head: Normocephalic.     Right Ear: Hearing normal.     Left Ear: Hearing normal.     Nose: Nose normal.     Mouth/Throat:     Mouth: Mucous membranes are moist.  Eyes:     General: Vision grossly intact.     Conjunctiva/sclera: Conjunctivae normal.  Cardiovascular:     Rate and Rhythm: Normal rate and regular rhythm.     Pulses:          Dorsalis pedis pulses are 2+ on the right side.     Heart sounds: Normal heart sounds.  Pulmonary:     Effort: Pulmonary effort is normal.     Breath sounds: Normal breath sounds and air entry.  Musculoskeletal:        General: Normal range of motion.     Cervical back: Normal range of motion and neck supple.     Right foot: Normal range of motion.  Feet:     Right foot:     Skin integrity: Skin integrity normal.     Toenail Condition: Right toenails are normal.     Comments: Left foot with normal alignment with no swelling, bruising, redness, or deformity. No tenderness to palpation over the dorsum, plantar surface, ankle, or toes. Full active and passive range of motion noted in all joints without pain or crepitus. Strength 5/5 in all directions. Sensation intact to light touch throughout the foot. Capillary refill less than 2 seconds, and pedal pulses palpable and symmetric.  Skin:    General: Skin is warm and dry.  Neurological:     General: No focal deficit present.     Mental Status: She is alert and oriented to person, place, and time.  Psychiatric:        Speech: Speech normal.        Behavior: Behavior is cooperative.      UC Treatments / Results  Labs (all labs ordered are listed, but only abnormal results are displayed) Labs Reviewed - No data to display  EKG   Radiology DG Foot Complete  Left Result Date: 09/18/2024 EXAM: 3 OR MORE VIEW(S) XRAY OF THE LEFT FOOT 09/18/2024 10:12:20 AM COMPARISON: None available. CLINICAL HISTORY: intermittent pain x  2 years after dropping something on foot FINDINGS: BONES AND JOINTS: No acute fracture. No focal osseous lesion. No joint dislocation. SOFT TISSUES: The soft tissues are unremarkable. IMPRESSION: 1. No acute osseous abnormality of the foot. Electronically signed by: Selinda Blue MD 09/18/2024 10:39 AM EST RP Workstation: [id number]    Procedures Procedures (including critical care time)  Medications Ordered in UC Medications - No data to display  Initial Impression / Assessment and Plan / UC Course  I have reviewed the triage vital signs and the nursing notes.  Pertinent labs & imaging results that were available during my care of the patient were reviewed by me and considered in my medical decision making (see chart for details).     Patient presents with intermittent left foot pain localized to the dorsal aspect, primarily occurring during exercise and resolving shortly afterward. History notable for prior trauma two years ago when a weight was dropped on the foot, which was never evaluated. Currently denies pain at rest, numbness, tingling, or discoloration. X-ray obtained today shows no acute fracture or bony abnormality, suggesting no new or unresolved bone injury. Assessment consistent with musculoskeletal pain, likely due to strain or soft tissue irritation. The patient was advised to wrap the foot with an ace bandage during activity for support, apply ice for 20 minutes after exercise, and monitor for worsening pain or swelling. Follow-up with orthopedics is recommended if symptoms persist, as additional evaluation such as physical therapy or MRI may be warranted to assess for tendon or ligament involvement. Patient advised to follow up with primary care or orthopedics for persistent or worsening symptoms and to seek emergency care  if sudden severe pain, swelling, or inability to bear weight develops.  Today's evaluation has revealed no signs of a dangerous process. Discussed diagnosis with patient and/or guardian. Patient and/or guardian aware of their diagnosis, possible red flag symptoms to watch out for and need for close follow up. Patient and/or guardian understands verbal and written discharge instructions. Patient and/or guardian comfortable with plan and disposition.  Patient and/or guardian has a clear mental status at this time, good insight into illness (after discussion and teaching) and has clear judgment to make decisions regarding their care  Documentation was completed with the aid of voice recognition software. Transcription may contain typographical errors.  Final Clinical Impressions(s) / UC Diagnoses   Final diagnoses:  Chronic pain in left foot     Discharge Instructions      Your foot x-ray today did not show any fractures or bone injury. The pain you are experiencing is likely from soft tissue strain or irritation. You may continue normal daily activities as tolerated, but if certain movements or exercises cause pain, it's best to stop and rest your foot. Wrapping your foot with an ace bandage before exercise can provide extra support, and applying ice for about 20 minutes after activity can help reduce discomfort or swelling. Over-the-counter pain relievers such as Tylenol  or ibuprofen  can be used as directed if needed for soreness. If your pain continues for more than a couple of weeks, or if you notice swelling, tenderness, or difficulty walking, follow up with your primary care provider or an orthopedic specialist. They may recommend physical therapy or an MRI to check for ligament or tendon problems that don't show up on an x-ray. Go to the emergency department if you develop sudden severe pain, significant swelling, bruising, numbness, or if you are unable to bear weight on your foot.  ED  Prescriptions   None    PDMP not reviewed this encounter.   Iola Lukes, [place] 09/18/24 1130

## 2024-09-23 DIAGNOSIS — F411 Generalized anxiety disorder: Secondary | ICD-10-CM | POA: Diagnosis not present

## 2024-09-30 DIAGNOSIS — F411 Generalized anxiety disorder: Secondary | ICD-10-CM | POA: Diagnosis not present

## 2024-10-07 DIAGNOSIS — F411 Generalized anxiety disorder: Secondary | ICD-10-CM | POA: Diagnosis not present

## 2024-10-13 DIAGNOSIS — F411 Generalized anxiety disorder: Secondary | ICD-10-CM | POA: Diagnosis not present
